# Patient Record
Sex: Male | Born: 1960 | Race: White | Hispanic: No | Marital: Married | Smoking: Never smoker
Health system: Southern US, Community
[De-identification: ages and names within clinical notes are randomized; demographics above are authoritative.]

---

## 2008-01-29 ENCOUNTER — Encounter (INDEPENDENT_AMBULATORY_CARE_PROVIDER_SITE_OTHER): Payer: Self-pay | Admitting: General Surgery

## 2008-01-30 ENCOUNTER — Observation Stay (HOSPITAL_COMMUNITY): Admission: EM | Admit: 2008-01-30 | Discharge: 2008-01-30 | Payer: Self-pay | Admitting: Emergency Medicine

## 2008-02-18 ENCOUNTER — Inpatient Hospital Stay (HOSPITAL_COMMUNITY): Admission: AD | Admit: 2008-02-18 | Discharge: 2008-02-22 | Payer: Self-pay | Admitting: General Surgery

## 2008-02-18 ENCOUNTER — Ambulatory Visit (HOSPITAL_COMMUNITY): Admission: RE | Admit: 2008-02-18 | Discharge: 2008-02-18 | Payer: Self-pay | Admitting: General Surgery

## 2008-02-19 ENCOUNTER — Encounter: Payer: Self-pay | Admitting: Diagnostic Radiology

## 2008-02-25 ENCOUNTER — Ambulatory Visit (HOSPITAL_COMMUNITY): Admission: RE | Admit: 2008-02-25 | Discharge: 2008-02-25 | Payer: Self-pay | Admitting: General Surgery

## 2008-06-26 IMAGING — CT CT PELVIS W/ CM
1 of 3 series · 13 of 32 positions shown, 19 images · IV contrast (Omnipaque 300)
Comparison: None available.

CLINICAL DATA: Right-sided abdominal pain. 
ABDOMEN CT WITH CONTRAST:
TECHNIQUE: Multidetector CT imaging of the abdomen was performed following the standard protocol during bolus administration of intravenous contrast.
Contrast:  100 ml Omnipaque 300.
TECHNIQUE: Multidetector CT imaging of the pelvis was performed following the standard protocol during bolus administration of intravenous contrast.

[Series 2: abd_pel 5.0 b40f · axial · 0.69mm/px · z∈[-412,-2]mm · 13 of 96 slices shown, 19 images]
[im 7/96  soft-tissue]
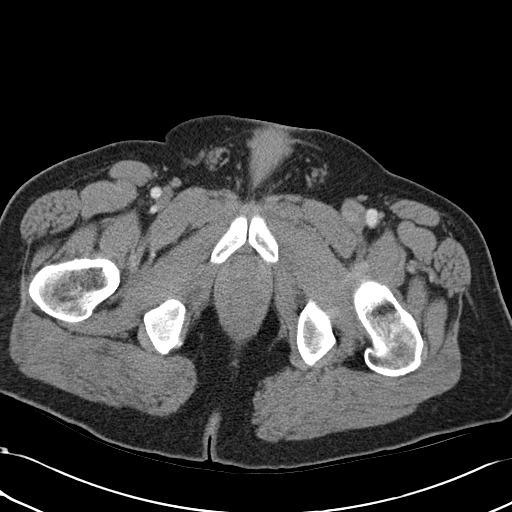
[im 7/96  bone]
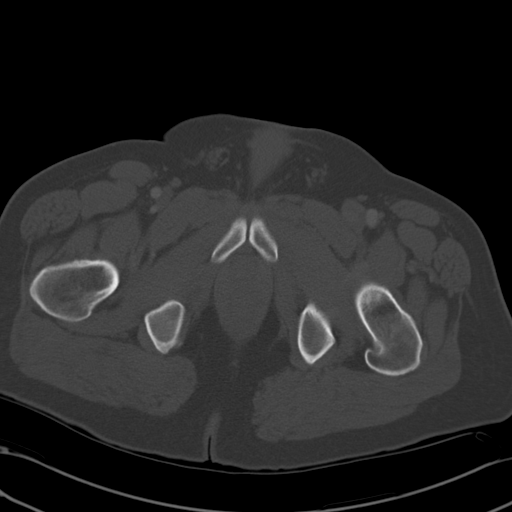
[im 14/96  soft-tissue]
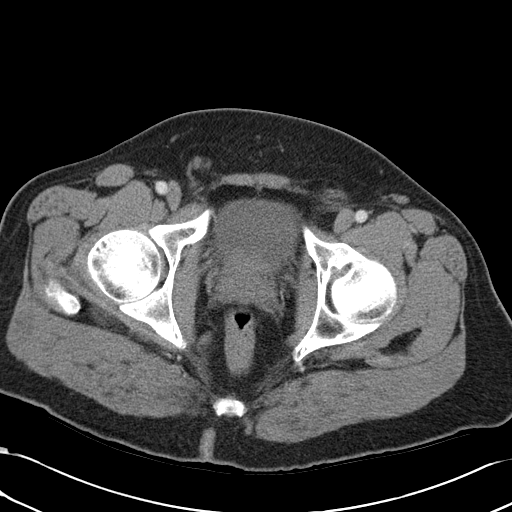
[im 21/96  soft-tissue]
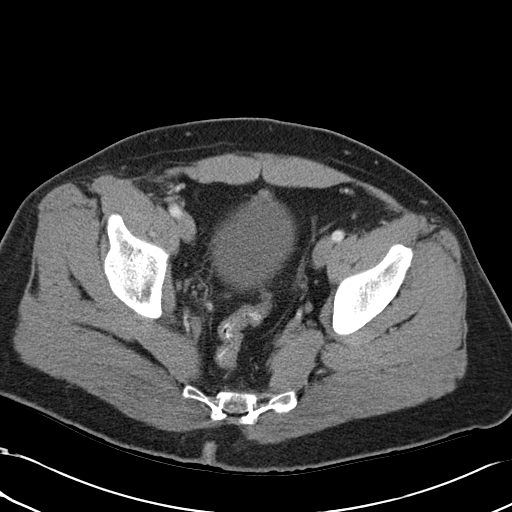
[im 28/96  soft-tissue]
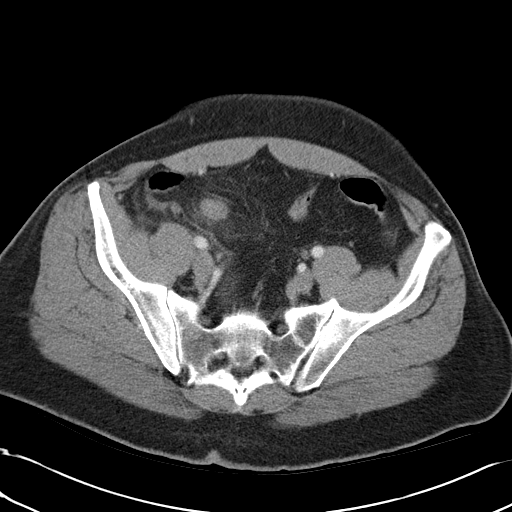
[im 34/96  soft-tissue]
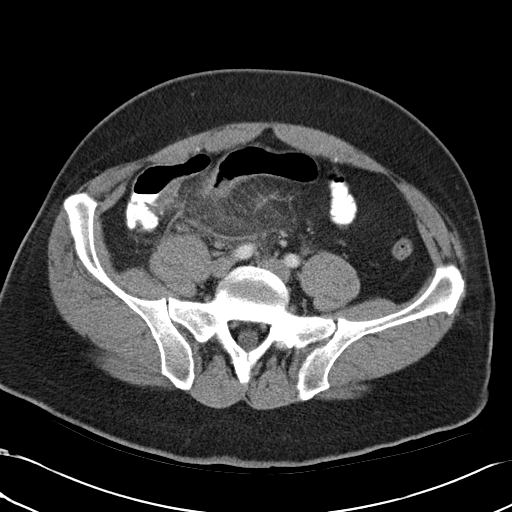
[im 41/96  soft-tissue]
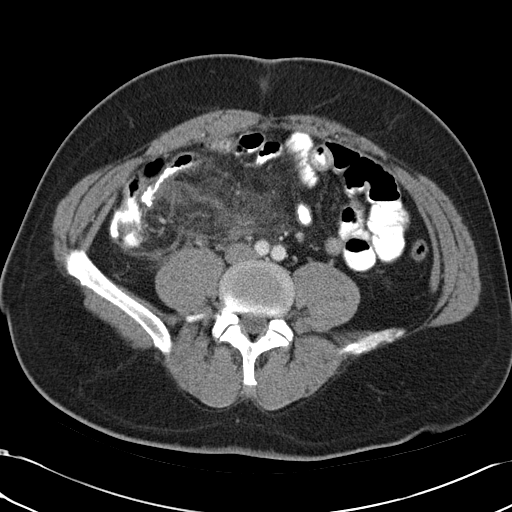
[im 48/96  soft-tissue]
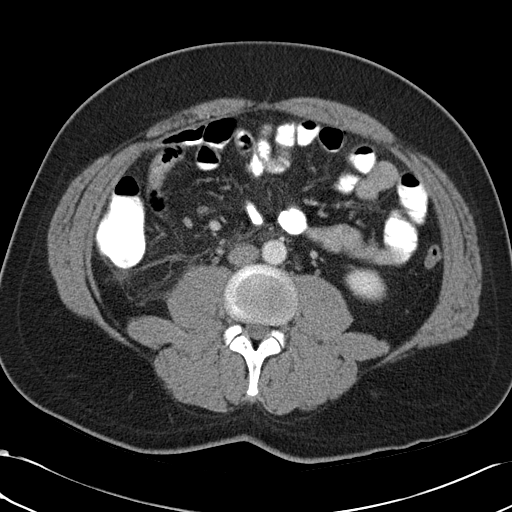
[im 55/96  soft-tissue]
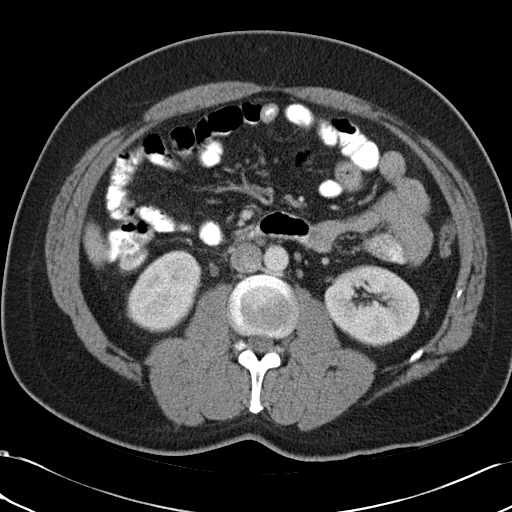
[im 62/96  soft-tissue]
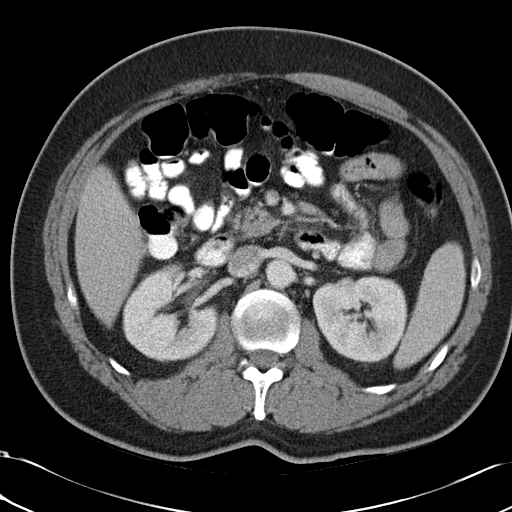
[im 62/96  bone]
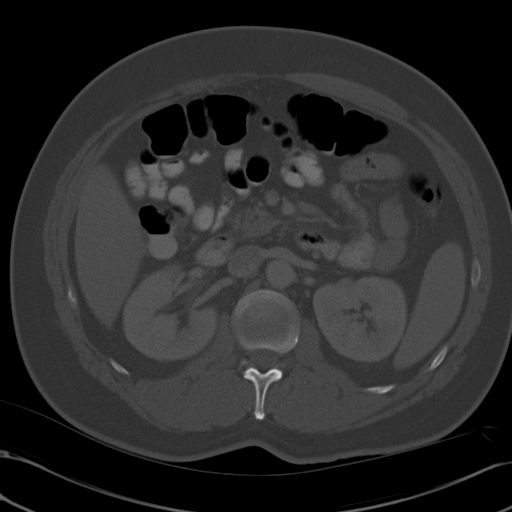
[im 68/96  soft-tissue]
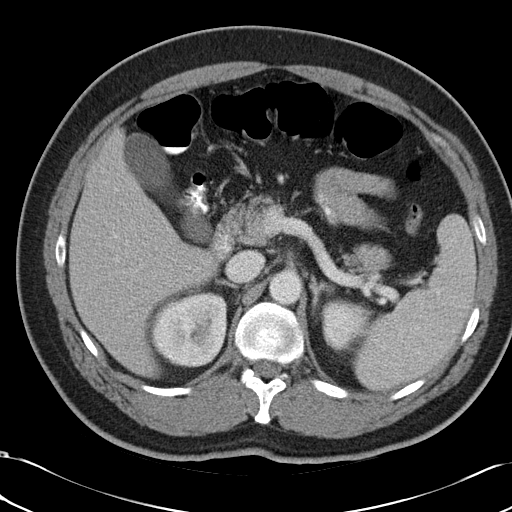
[im 68/96  lung]
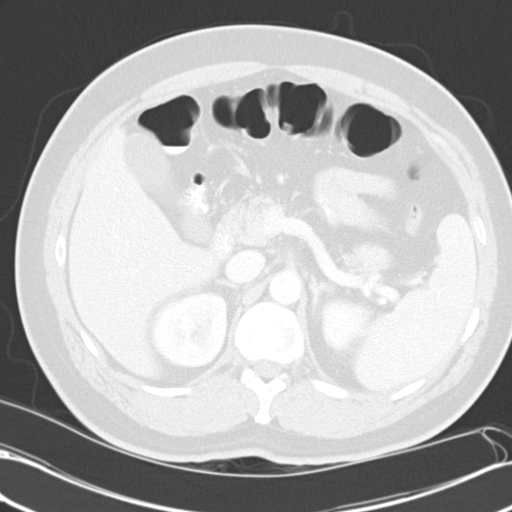
[im 75/96  soft-tissue]
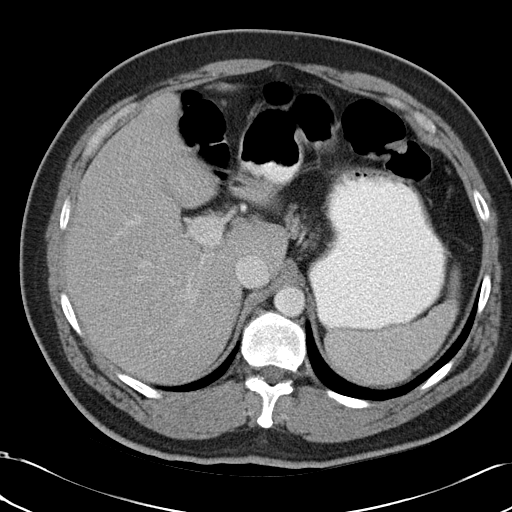
[im 75/96  lung]
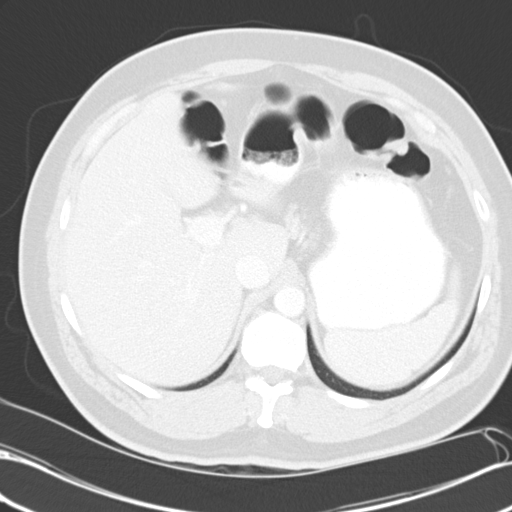
[im 82/96  soft-tissue]
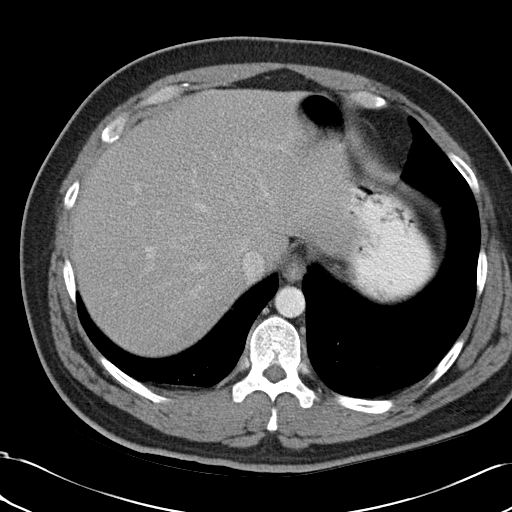
[im 82/96  lung]
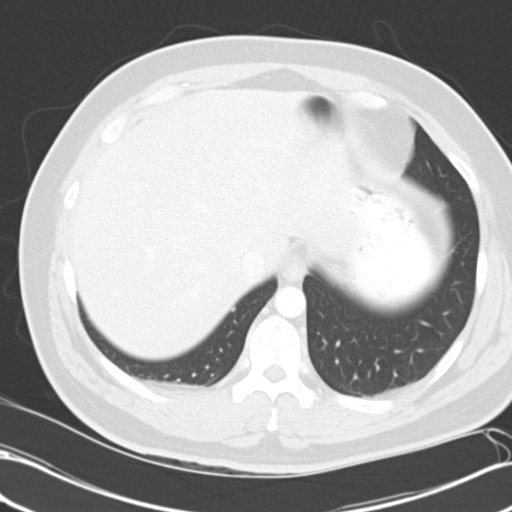
[im 89/96  soft-tissue]
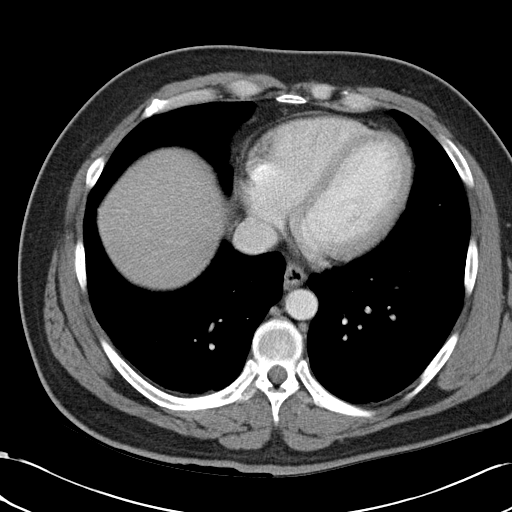
[im 89/96  lung]
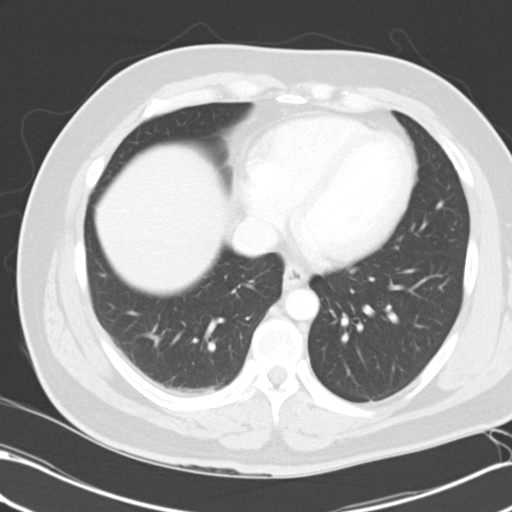

[13 of 32 positions shown; findings below may reference images not displayed]

FINDINGS: The liver, spleen, pancreas and kidneys are normal.  The bowel is not dilated.  There is no free fluid.
IMPRESSION: Negative. 
PELVIS CT WITH CONTRAST:
FINDINGS: There is a dilated fluid filled bowel loop in the mid pelvis.  This extends over to the cecum and appears to be blind ending.  I believe this is a distended acutely infected appendix.  There is a gas bubble within the appendix.  No perforation or abscess is identified.  There is stranding in the fat around the appendix due to acute inflammation and there is some thickening of the sigmoid colon and a small bowel loop which run near the appendix.  There is no free fluid or abscess.
IMPRESSION: Acute appendicitis with inflammation in the vicinity surrounding the appendix including some thickened small and large bowel loops.

## 2008-07-17 IMAGING — CT CT ABCESS DRAINAGE
1 of 3 series · 14 of 32 positions shown, 18 images · non-contrast
Comparison: none

05/26/08 – REPORT NOW REFLECTS CORRECT ORDERING PHYSICIAN.
CLINICAL HISTORY: Appendectomy with a postoperative abscess.

[Series 2: abd pelvis · axial · 0.70mm/px · z∈[-175,-35]mm · 14 of 149 slices shown, 18 images]
[im 7/149  soft-tissue]
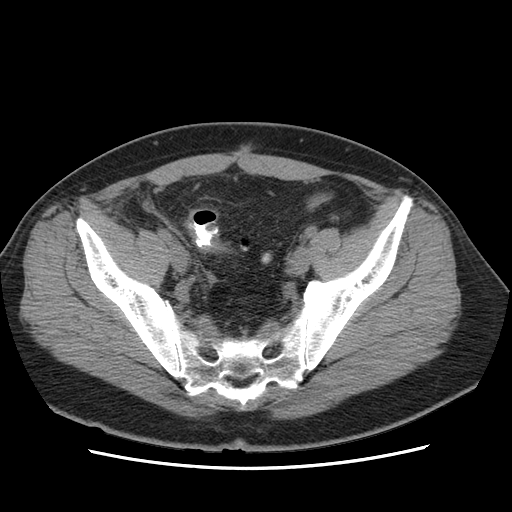
[im 7/149  bone]
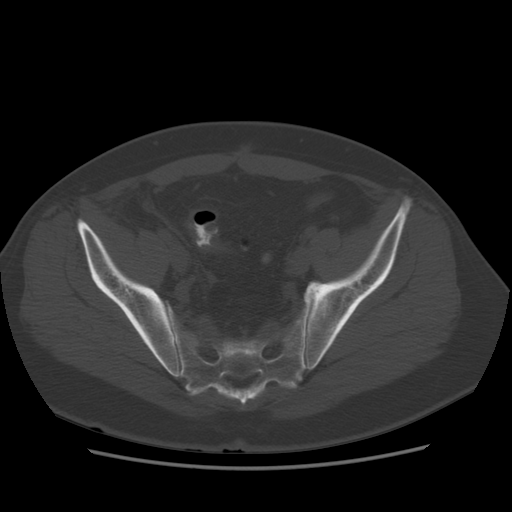
[im 19/149  soft-tissue]
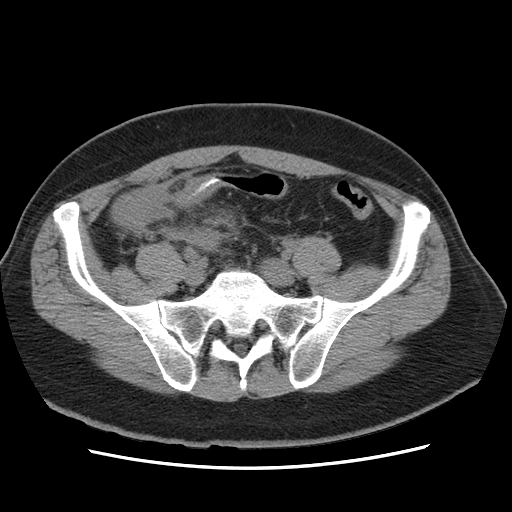
[im 31/149  soft-tissue]
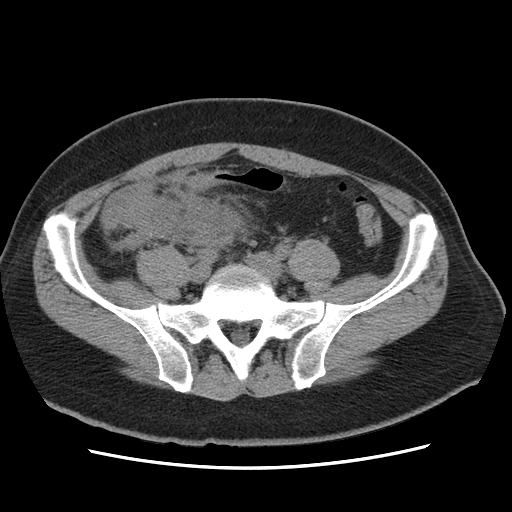
[im 44/149  soft-tissue]
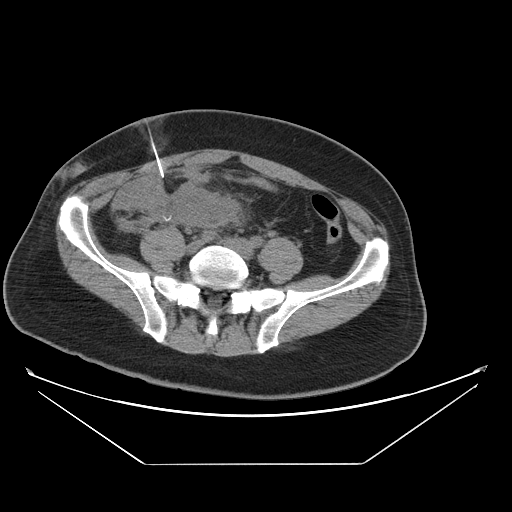
[im 56/149  soft-tissue]
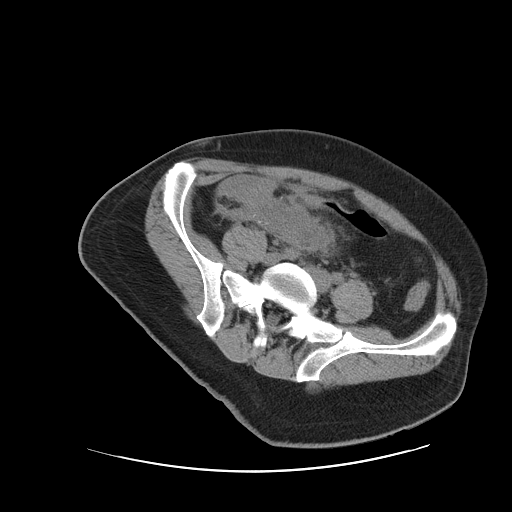
[im 68/149  soft-tissue]
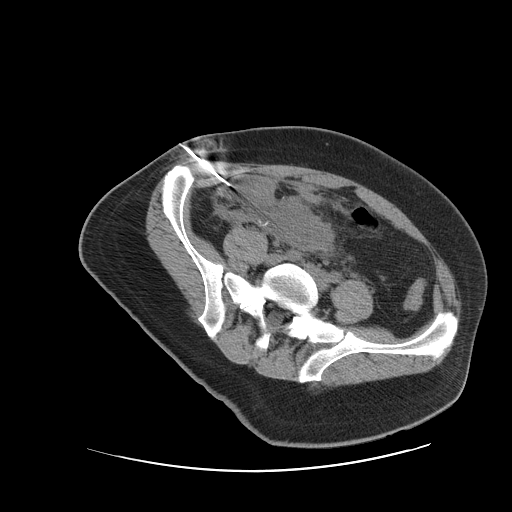
[im 81/149  soft-tissue]
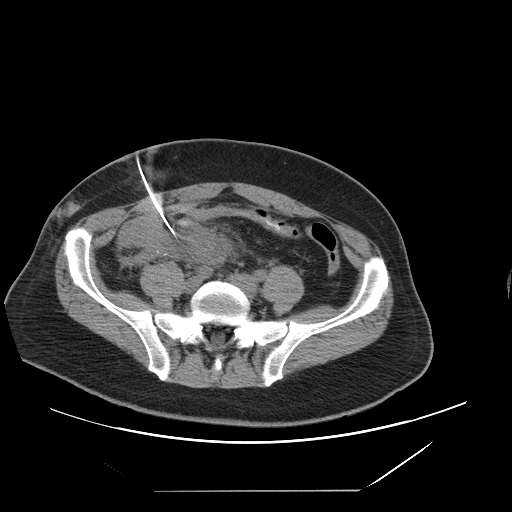
[im 93/149  soft-tissue]
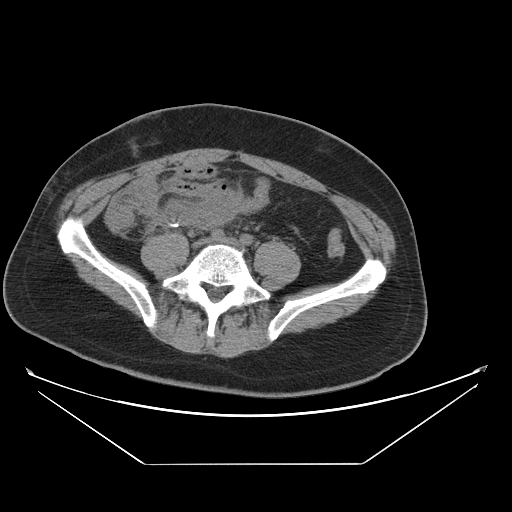
[im 105/149  soft-tissue]
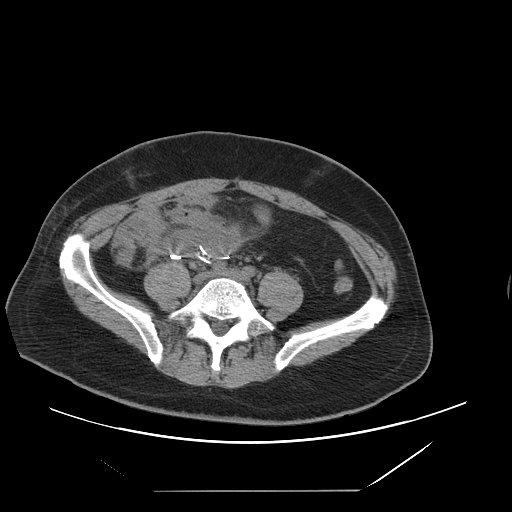
[im 105/149  bone]
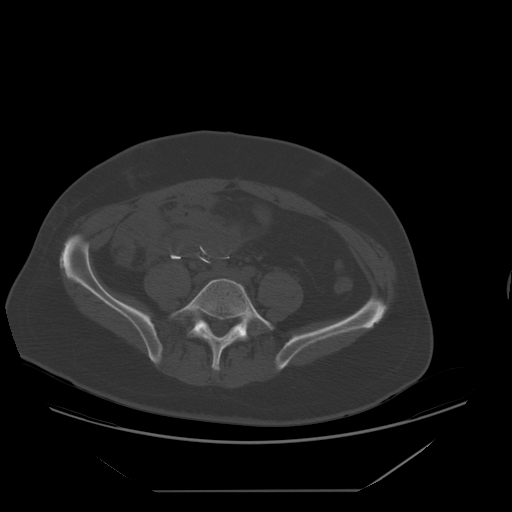
[im 118/149  soft-tissue]
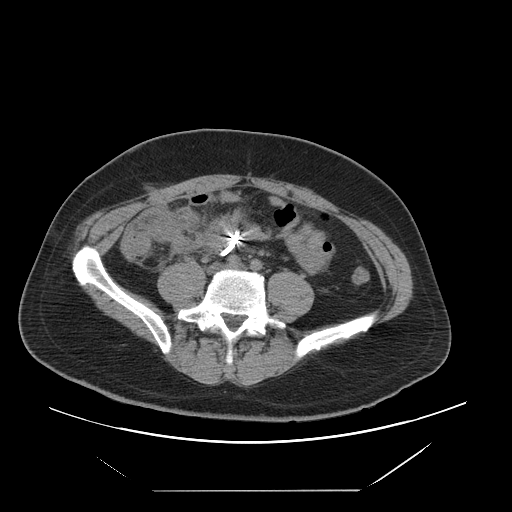
[im 124/149  lung]
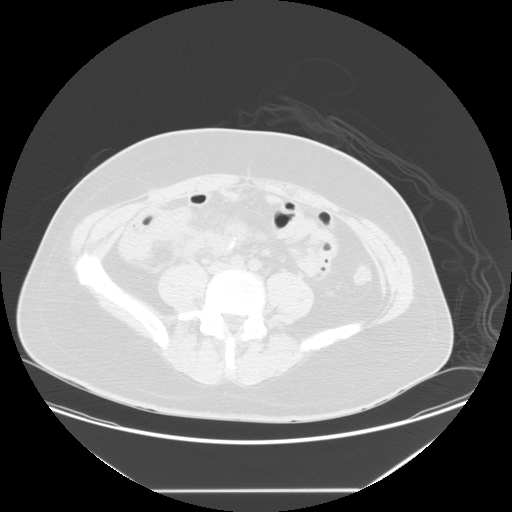
[im 130/149  soft-tissue]
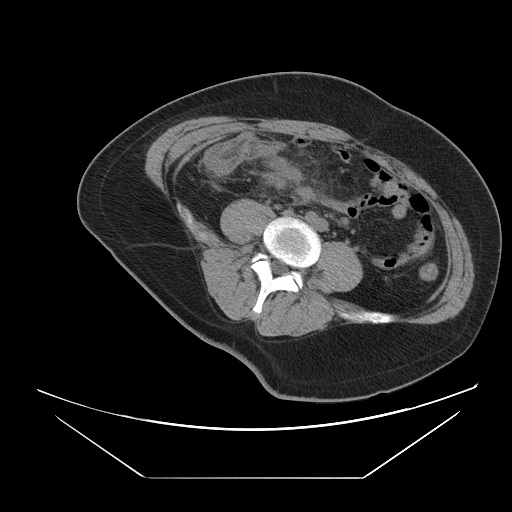
[im 130/149  lung]
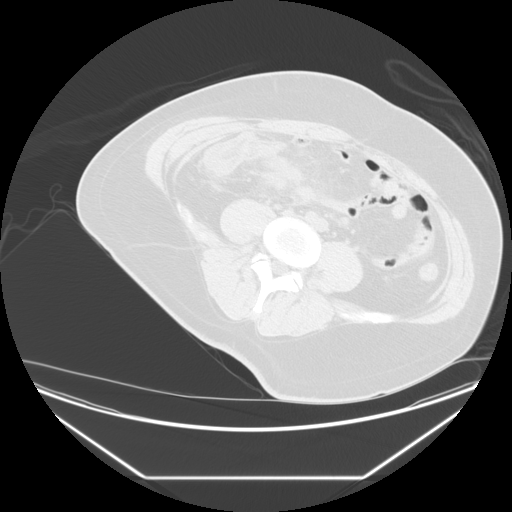
[im 136/149  lung]
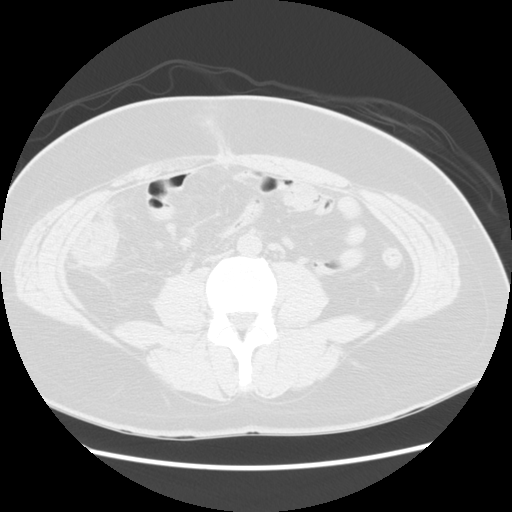
[im 142/149  soft-tissue]
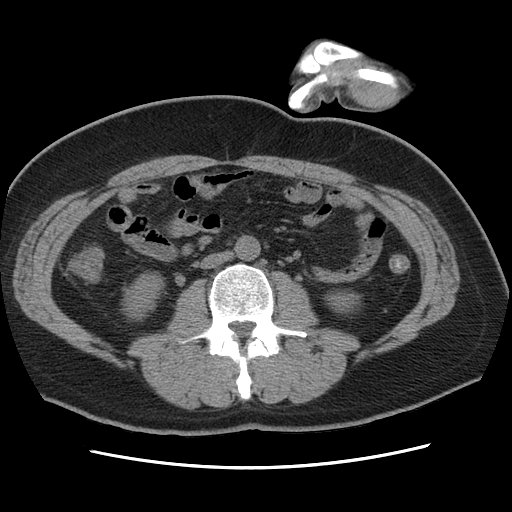
[im 142/149  lung]
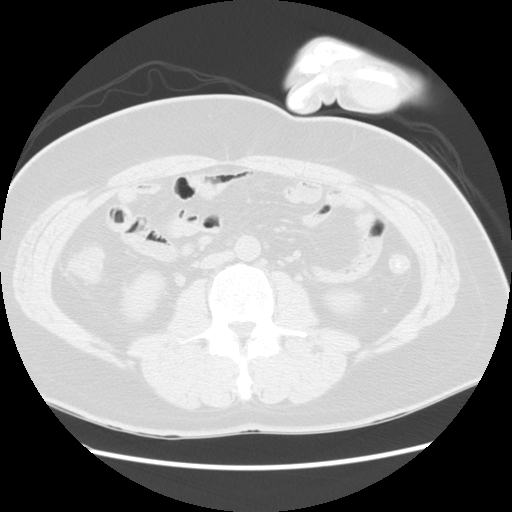

[14 of 32 positions shown; findings below may reference images not displayed]

PROCEDURE(S): CT GUIDED DRAIN PLACEMENT.

 Medications:Versed 4 mg, Fentanyl 200 mcg

 Sedation time:55 minutes

 Procedure:Informed was obtained from the patient. The patient was
 initially placed in a supine position and then repositioned with
 the right side slightly elevated. CT images demonstrated a fluid
 collection in the right lower quadrant with associated surgical
 clips. The right lateral abdomen was prepped with Betadine and a
 sterile drape was placed. The skin was anesthetized with 1%
 lidocaine. An 18 gauge needle was directed towards the right lower
 quadrant fluid collection and directed just above the right iliac
 crest. However, the needle could not be a directed into this
 collection through this pathway. As a result, the patient was
 placed back in a supine position and a small percutaneous window
 was identified from an anterior approach. Comparison was made with
 the previous diagnostic exam in order to identify the inferior
 epigastric artery. The skin was prepped with Betadine and a
 sterile drape was placed. A 21 gauge needle was directed through
 this anterior window and into the abscess collection. Purulent
 fluid was aspirated. A micropuncture dilator set was placed and
 eventually removed over a stiff Amplatz wire. 9 and 10-French
 dilator sets were used. A 10 French pigtail catheter was place
 over the Amplatz wire and reconstituted within the abscess
 collection. 55 ml of purulent fluid was removed. Catheter was
 secured to the skin using Prolene suture and a sterile dressing was
 placed. Catheter was attached to a Jackson-Pratt drain.
FINDINGS: Fluid collection in the right lower quadrant adjacent to
 the cecum and terminal ileum. A 10 French pigtail catheter was
 placed within this collection and the majority the fluid was
 aspirated at the end of the procedure.
IMPRESSION: Successful placement of a CT guided drain into the right lower
 quadrant abscess collection. 55 ml of purulent fluid was removed.

## 2011-03-29 NOTE — Discharge Summary (Signed)
NAME:  Reginald Koch, Reginald Koch NO.:  [id number]   MEDICAL RECORD NO.:  [id number]          PATIENT TYPE:  INP   LOCATION:  A312                          FACILITY:  [facility]   PHYSICIAN:  Dalia Heading, M.D.  DATE OF BIRTH:  [birth year]   DATE OF ADMISSION:  02/18/2008  DATE OF DISCHARGE:  04/10/2009LH                               DISCHARGE SUMMARY   HOSPITAL COURSE SUMMARY:  The patient is a 50 year old white male who is  status post a laparoscopic appendectomy for gangrenous appendicitis on  January 29, 2008, who presented with a right lower quadrant abdominal  pain.  A CT scan of the abdomen and pelvis was performed which revealed  an intraabdominal abscess.  He was admitted to the hospital for further  evaluation and treatment.  On February 19, 2008, he underwent CT-guided  percutaneous drainage of the abscess.  He tolerated the procedure well.  His white blood cell count on the following day returned to normal.  Culture results show E. coli, though no organisms were seen on initial  Gram stain.  He is being discharged home with a drain in place on February 22, 2008.  He is to have a followup CT scan of the abdomen and pelvis  with rectal contrast on February 25, 2008, and to follow up with my  partner, Dr. Leticia Penna, on February 26, 2008.   DISCHARGE MEDICATIONS:  1. Ibuprofen as needed for pain.  2. Augmentin 875 mg p.o. b.i.d. x10 days.  3. Tylenol p.r.n.   PRINCIPAL DIAGNOSES:  Intraabdominal abscess, status post laparoscopic  appendectomy.   PRINCIPAL PROCEDURE:  None.      Dalia Heading, M.D.  Electronically Signed     MAJ/MEDQ  D:  02/22/2008  T:  02/23/2008  Job:  [id number]

## 2011-03-29 NOTE — H&P (Signed)
NAMEMATTSON, DAYAL NO.:  [id number]   MEDICAL RECORD NO.:  [id number]          PATIENT TYPE:  INP   LOCATION:  A312                          FACILITY:  [facility]   PHYSICIAN:  Dalia Heading, M.D.  DATE OF BIRTH:  [birth year]   DATE OF ADMISSION:  02/18/2008  DATE OF DISCHARGE:  LH                              HISTORY & PHYSICAL   CHIEF COMPLAINT:  Intra-abdominal abscess.   HISTORY OF PRESENT ILLNESS:  The patient is a 50 year old white male  status post a laparoscopic appendectomy for gangrenous appendicitis on  January 29, 2008, who presents with a 5-day history of worsening nausea,  decreased appetite, and fevers.  Initially, he had a sore throat and  multiple family members were ill.  Today, he contacted me and stated  that his sore throat had resolved, but his appetite was decreased.  He  was still having fevers.  A CT scan of the abdomen and pelvis was  performed which revealed an intra-abdominal abscess that was well  contained, within the right lower quadrant.  The patient is being  admitted to the hospital for intravenous antibiotic therapy.   PAST MEDICAL HISTORY:  Is unremarkable.   PAST SURGICAL HISTORY:  As noted above.   CURRENT MEDICATIONS:  None.   ALLERGIES:  NO KNOWN DRUG ALLERGIES.   REVIEW OF SYSTEMS:  Noncontributory.   PHYSICAL EXAMINATION:  The patient is a well-developed, well-nourished  white male in no acute distress.  LUNGS:  Clear to auscultation with equal breath sounds bilaterally.  HEART:  Examination reveals regular rate and rhythm without S3, S4, or  murmurs.  ABDOMEN: The abdomen is soft with nonspecific tenderness in the right  lower quadrant.  No rigidity is noted.  No hepatosplenomegaly or masses  are noted.  Well-healed surgical scars are noted.   CT scan of the abdomen and pelvis reveals a contained abscess in the  right lower quadrant.  No significant free fluid or other  intraperitoneal fluid collections are  noted.   IMPRESSION:  Intra-abdominal abscess, status post laparoscopic  appendectomy.   PLAN:  The patient admitted to hospital for intravenous antibiotic  therapy.  He subsequently will undergo transfer to [facility] for CT-  guided drainage of the intra-abdominal abscess.  The risks and benefits  of the procedure were  fully explained to the patient, gives informed consent.  He does realize  that he did have a gangrenous appendix and despite Jackson-Pratt  drainage of the abscess and postoperative antibiotics, the infection has  occurred.  All questions have been answered.      Dalia Heading, M.D.  Electronically Signed     MAJ/MEDQ  D:  02/18/2008  T:  02/18/2008  Job:  [id number]

## 2011-03-29 NOTE — Op Note (Signed)
NAMERASHEED, WELTY NO.:  [id number]   MEDICAL RECORD NO.:  [id number]          PATIENT TYPE:  AMB   LOCATION:  DAY                           FACILITY:  [facility]   PHYSICIAN:  Dalia Heading, M.D.  DATE OF BIRTH:  12/30/1960   DATE OF PROCEDURE:  01/29/2008  DATE OF DISCHARGE:                               OPERATIVE REPORT   PREOPERATIVE DIAGNOSIS:  Acute appendicitis.   POSTOPERATIVE DIAGNOSIS:  Acute appendicitis, gangrenous.   PROCEDURE:  Laparoscopic appendectomy.   SURGEON:  Dr. Franky Macho.   ANESTHESIA:  General endotracheal.   INDICATIONS:  The patient is a 50 year old white male who presents with  a 4-day history of worsening right lower quadrant abdominal pain.  CT  scan of the pelvis reveals acute appendicitis.  Risks and benefits of  the procedure including bleeding, infection, and the possibility an open  procedure were fully explained to the patient, who gave informed  consent.   PROCEDURE NOTE:  The patient was placed in supine position.  After  induction of general endotracheal anesthesia, the abdomen was prepped  and draped in the usual sterile technique with Betadine.  Surgical site  confirmation was performed.   A supraumbilical incision was made down to fascia.  Veress needle was  introduced into the abdominal cavity and confirmation of placement was  done using the saline drop test.  The abdomen was then insufflated to 16  mmHg pressure.  11 mm trocar was introduced into the abdominal cavity  under direct visualization without difficulty.  The patient was placed  in deeper Trendelenburg position.  An additional 12 mm trocar was placed  in suprapubic region and 5 mm trocars placed in the right lower  quadrant, left lower quadrant regions.  The appendix was visualized and  noted to be gangrenous.  It was freed away from the mesentery of the  terminal ileum as well as the sigmoid colon.  The mesoappendix was  divided using the harmonic  scalpel.  A vascular Endo-GIA was placed  across the base of the appendix and fired.  In addition, multiple Vicryl  Endoloops x 3 were placed around the stump of the appendiceal remnant  due to its friable nature.  There was no leakage of stool during the  procedure.  A several millimeterappendiceal stump was present.  A  Jackson-Pratt drain was placed in the right lower quadrant, brought  through the right lower quadrant 5-mm trocar site.  It was secured at  the skin level using a 3-0 nylon interrupted suture.  The appendix was  removed using an EndoCatch bag.  The right lower quadrant was copiously  irrigated with normal saline.  All fluid and air were then evacuated  from the abdominal cavity prior to removal of the trocars.   All wounds were irrigated with normal saline.  All wounds were injected  with 0.5% Sensorcaine.  The supraumbilical fascia as well as suprapubic  fascia reapproximated using 0 Vicryl interrupted sutures.  All skin  incisions were closed using staples.  Betadine ointment and dry sterile  dressings were applied.   All tape  and needle counts were correct at the end of the procedure.  The patient was extubated in the operating room and went back to  recovery room awake in stable condition.   COMPLICATIONS:  None.   SPECIMEN:  Appendix.   DRAINS:  Jackson-Pratt drain to right lower quadrant.   BLOOD LOSS:  Minimal.      Dalia Heading, M.D.  Electronically Signed     MAJ/MEDQ  D:  01/29/2008  T:  01/30/2008  Job:  [id number]

## 2011-08-08 LAB — DIFFERENTIAL
Basophils Absolute: 0
Basophils Absolute: 0
Basophils Relative: 0
Basophils Relative: 0
Eosinophils Absolute: 0
Eosinophils Absolute: 0.1
Monocytes Absolute: 1.1 — ABNORMAL HIGH
Monocytes Relative: 9
Neutro Abs: 7.4
Neutrophils Relative %: 82 — ABNORMAL HIGH
Neutrophils Relative %: 85 — ABNORMAL HIGH

## 2011-08-08 LAB — CBC
HCT: 41.3
Hemoglobin: 14.3
MCHC: 35
MCV: 85.7
Platelets: 213
RDW: 12.9
WBC: 13.4 — ABNORMAL HIGH

## 2011-08-08 LAB — COMPREHENSIVE METABOLIC PANEL
ALT: 49
Albumin: 3.6
Alkaline Phosphatase: 70
BUN: 9
Chloride: 99
Glucose, Bld: 109 — ABNORMAL HIGH
Potassium: 3.7
Sodium: 136
Total Bilirubin: 1.3 — ABNORMAL HIGH
Total Protein: 7

## 2011-08-08 LAB — URINALYSIS, ROUTINE W REFLEX MICROSCOPIC
Glucose, UA: NEGATIVE
Leukocytes, UA: NEGATIVE
Protein, ur: >300 — AB
pH: 6.5

## 2011-08-08 LAB — URINE MICROSCOPIC-ADD ON

## 2011-08-09 LAB — DIFFERENTIAL
Basophils Absolute: 0
Eosinophils Relative: 3
Lymphocytes Relative: 10 — ABNORMAL LOW
Lymphocytes Relative: 27
Lymphs Abs: 1.7
Lymphs Abs: 2.1
Monocytes Absolute: 1.6 — ABNORMAL HIGH
Neutro Abs: 13 — ABNORMAL HIGH
Neutrophils Relative %: 61

## 2011-08-09 LAB — CBC
HCT: 31.2 — ABNORMAL LOW
Hemoglobin: 12.6 — ABNORMAL LOW
Platelets: 425 — ABNORMAL HIGH
RBC: 3.79 — ABNORMAL LOW
RBC: 4.46
RDW: 13.5
WBC: 16.5 — ABNORMAL HIGH
WBC: 7.9

## 2011-08-09 LAB — APTT: aPTT: 33

## 2011-08-09 LAB — CULTURE, ROUTINE-ABSCESS

## 2011-08-09 LAB — BASIC METABOLIC PANEL
Calcium: 9.3
GFR calc Af Amer: >60
GFR calc non Af Amer: >60
Potassium: 3.8
Sodium: 136

## 2015-07-01 ENCOUNTER — Other Ambulatory Visit: Payer: Self-pay | Admitting: Gastroenterology

## 2015-07-13 ENCOUNTER — Other Ambulatory Visit: Payer: Self-pay | Admitting: Dermatology

## 2016-06-12 ENCOUNTER — Emergency Department (HOSPITAL_COMMUNITY)
Admission: EM | Admit: 2016-06-12 | Discharge: 2016-06-12 | Disposition: A | Discharge status: Home / Self Care | Payer: 59 | Attending: Emergency Medicine | Admitting: Emergency Medicine

## 2016-06-12 ENCOUNTER — Encounter (HOSPITAL_COMMUNITY): Payer: Self-pay | Admitting: Emergency Medicine

## 2016-06-12 DIAGNOSIS — Y929 Unspecified place or not applicable: Secondary | ICD-10-CM | POA: Diagnosis not present

## 2016-06-12 DIAGNOSIS — Y939 Activity, unspecified: Secondary | ICD-10-CM | POA: Diagnosis not present

## 2016-06-12 DIAGNOSIS — Z791 Long term (current) use of non-steroidal anti-inflammatories (NSAID): Secondary | ICD-10-CM | POA: Diagnosis not present

## 2016-06-12 DIAGNOSIS — S61411A Laceration without foreign body of right hand, initial encounter: Secondary | ICD-10-CM | POA: Insufficient documentation

## 2016-06-12 DIAGNOSIS — Y999 Unspecified external cause status: Secondary | ICD-10-CM | POA: Insufficient documentation

## 2016-06-12 DIAGNOSIS — S6991XA Unspecified injury of right wrist, hand and finger(s), initial encounter: Secondary | ICD-10-CM | POA: Diagnosis present

## 2016-06-12 DIAGNOSIS — IMO0002 Reserved for concepts with insufficient information to code with codable children: Secondary | ICD-10-CM

## 2016-06-12 DIAGNOSIS — W228XXA Striking against or struck by other objects, initial encounter: Secondary | ICD-10-CM | POA: Diagnosis not present

## 2016-06-12 DIAGNOSIS — Z23 Encounter for immunization: Secondary | ICD-10-CM | POA: Insufficient documentation

## 2016-06-12 MED ORDER — LIDOCAINE HCL (PF) 1 % IJ SOLN
INTRAMUSCULAR | Status: AC
  Filled 2016-06-12: qty 5

## 2016-06-12 MED ORDER — LIDOCAINE-EPINEPHRINE (PF) 1 %-1:200000 IJ SOLN
10.0000 mL | Freq: Once | INTRAMUSCULAR | Status: DC
  Filled 2016-06-12: qty 30

## 2016-06-12 MED ORDER — TETANUS-DIPHTH-ACELL PERTUSSIS 5-2.5-18.5 LF-MCG/0.5 IM SUSP
0.5000 mL | Freq: Once | INTRAMUSCULAR | Status: AC
  Administered 2016-06-12: 0.5 mL via INTRAMUSCULAR
  Filled 2016-06-12: qty 0.5

## 2016-06-12 MED ORDER — IBUPROFEN 800 MG PO TABS: 800.0000 mg | ORAL_TABLET | Freq: Three times a day (TID) | ORAL | 0 refills | Status: AC

## 2016-06-12 NOTE — Discharge Instructions (Signed)

## 2016-06-12 NOTE — ED Provider Notes (Addendum)
[facility]-EMERGENCY DEPT Provider Note   CSN: [id number] Arrival date & time: 06/12/16  1304  First Provider Contact:  First MD Initiated Contact with Patient 06/12/16 1406        History   Chief Complaint Chief Complaint  Patient presents with  . Laceration    HPI Reginald Koch is a 55 y.o. male.  The patient is a 55 year old male who was pressure washing a water filter at home when his hand slipped and the pressure washing water spray injured his hand. His right hand is injured, there is a laceration, it is constant, it is moderate, the bleeding was well-controlled with pressure, the pressure washing material was tap water, there was no other chemicals in the water. This occurred just prior to arrival.    Laceration      History reviewed. No pertinent past medical history.  There are no active problems to display for this patient.   History reviewed. No pertinent surgical history.     Home Medications    Prior to Admission medications   Medication Sig Start Date End Date Taking? Authorizing Provider  ibuprofen (ADVIL,MOTRIN) 800 MG tablet Take 1 tablet (800 mg total) by mouth 3 (three) times daily. 06/12/16   Eber Hong, MD    Family History History reviewed. No pertinent family history.  Social History Social History  Substance Use Topics  . Smoking status: Never Smoker  . Smokeless tobacco: Never Used  . Alcohol use No     Allergies   Review of patient's allergies indicates no known allergies.   Review of Systems Review of Systems  Constitutional: Negative for fever.  Gastrointestinal: Negative for vomiting.  Skin: Positive for wound.       Laceration  Neurological: Negative for weakness and numbness.    Physical Exam Updated Vital Signs BP 137/99 (BP Location: Left Arm)   Pulse 81   Temp 98.4 F (36.9 C) (Oral)   Resp 14   SpO2 100%   Physical Exam  Constitutional: He appears well-developed and well-nourished.  HENT:  Head:  Normocephalic and atraumatic.  Eyes: Conjunctivae are normal. Right eye exhibits no discharge. Left eye exhibits no discharge.  Pulmonary/Chest: Effort normal. No respiratory distress.  Musculoskeletal:  Normal grip / strength and flexor / extensor function of the R hand - laceration dorsum of hand  Neurological: He is alert. Coordination normal.  Normal strength and sensation of the R  Hand / fingers  Skin: Skin is warm and dry. No rash noted. He is not diaphoretic. No erythema.  Lac as described in repair note  Psychiatric: He has a normal mood and affect.  Nursing note and vitals reviewed.    ED Treatments / Results  Labs (all labs ordered are listed, but only abnormal results are displayed) Labs Reviewed - No data to display   Radiology No results found.  Procedures .Marland KitchenLaceration Repair Date/Time: 06/12/2016 3:24 PM Performed by: Eber Hong Authorized by: Eber Hong   Consent:    Consent obtained:  Verbal   Consent given by:  Patient   Risks discussed:  Infection, pain, need for additional repair, poor cosmetic result and poor wound healing Anesthesia (see MAR for exact dosages):    Anesthesia method:  Local infiltration   Local anesthetic:  Lidocaine 1% w/o epi Laceration details:    Location:  Hand   Hand location:  R hand, dorsum   Length (cm):  3   Depth (mm):  4 Pre-procedure details:    Preparation:  Patient  was prepped and draped in usual sterile fashion Exploration:    Hemostasis obtained with: there was no active bleeding.   Wound exploration: wound explored through full range of motion and entire depth of wound probed and visualized     Wound exploration comment:  In a bloodless field   Wound extent: no nerve damage noted, no tendon damage noted, no underlying fracture noted and no vascular damage noted     Contaminated: no   Treatment:    Area cleansed with:  Saline and Betadine   Amount of cleaning:  Extensive   Irrigation solution:  Sterile  saline   Irrigation volume:  2000 mL   Irrigation method:  Syringe   Visualized foreign bodies/material removed: no   Skin repair:    Repair method:  Sutures   Suture size:  4-0   Suture material:  Prolene   Suture technique:  Simple interrupted   Number of sutures:  5 Approximation:    Approximation:  Close   Vermilion border: well-aligned   Post-procedure details:    Dressing:  Antibiotic ointment and sterile dressing   Patient tolerance of procedure:  Tolerated well, no immediate complications    (including critical care time)  Medications Ordered in ED Medications  lidocaine-EPINEPHrine (XYLOCAINE-EPINEPHrine) 1 %-1:200000 (PF) injection 10 mL (not administered)  Tdap (BOOSTRIX) injection 0.5 mL (not administered)  lidocaine (PF) (XYLOCAINE) 1 % injection (not administered)     Initial Impression / Assessment and Plan / ED Course  I have reviewed the triage vital signs and the nursing notes.  Pertinent labs & imaging results that were available during my care of the patient were reviewed by me and considered in my medical decision making (see chart for details).  Clinical Course    Well appearing, has well approximated laceration that is hemostatic, exhaustive wound care provided with irrigation and dressing after sutures placed, pt expressed undersetanding about need for close f/u.  No tendon / ligament or nerve / artery injury  Final Clinical Impressions(s) / ED Diagnoses   Final diagnoses:  Laceration    New Prescriptions New Prescriptions   IBUPROFEN (ADVIL,MOTRIN) 800 MG TABLET    Take 1 tablet (800 mg total) by mouth 3 (three) times daily.     Eber Hong, MD 06/12/16 1537    Eber Hong, MD 06/23/16 1600

## 2016-06-12 NOTE — ED Triage Notes (Signed)
Pt states he was using a pressure washer and his right hand was hit.

## 2017-05-26 DIAGNOSIS — Z125 Encounter for screening for malignant neoplasm of prostate: Secondary | ICD-10-CM | POA: Diagnosis not present

## 2017-05-26 DIAGNOSIS — Z0000 Encounter for general adult medical examination without abnormal findings: Secondary | ICD-10-CM | POA: Diagnosis not present

## 2019-04-30 ENCOUNTER — Other Ambulatory Visit: Payer: Self-pay

## 2019-04-30 ENCOUNTER — Other Ambulatory Visit: Payer: 59

## 2019-04-30 DIAGNOSIS — Z20822 Contact with and (suspected) exposure to covid-19: Secondary | ICD-10-CM

## 2019-04-30 NOTE — Progress Notes (Signed)
[id number] 

## 2019-05-02 LAB — NOVEL CORONAVIRUS, NAA: SARS-CoV-2, NAA: NOT DETECTED
